# Patient Record
Sex: Female | Born: 1935 | Race: White | Hispanic: No
Health system: Midwestern US, Academic
[De-identification: ages and names within clinical notes are randomized; demographics above are authoritative.]

---

## 2017-01-05 ENCOUNTER — Encounter: Admit: 2017-01-05 | Discharge: 2017-01-05 | Payer: MEDICARE

## 2017-01-05 ENCOUNTER — Ambulatory Visit: Admit: 2017-01-05 | Discharge: 2017-01-06 | Payer: MEDICARE

## 2017-01-05 DIAGNOSIS — W19XXXA Unspecified fall, initial encounter: ICD-10-CM

## 2017-01-05 DIAGNOSIS — M1990 Unspecified osteoarthritis, unspecified site: Principal | ICD-10-CM

## 2017-01-05 DIAGNOSIS — I10 Essential (primary) hypertension: ICD-10-CM

## 2017-01-05 DIAGNOSIS — F0390 Unspecified dementia without behavioral disturbance: ICD-10-CM

## 2017-01-05 DIAGNOSIS — H3530 Unspecified macular degeneration: ICD-10-CM

## 2017-01-05 DIAGNOSIS — F329 Major depressive disorder, single episode, unspecified: ICD-10-CM

## 2017-01-05 DIAGNOSIS — R4189 Other symptoms and signs involving cognitive functions and awareness: Secondary | ICD-10-CM

## 2017-01-05 DIAGNOSIS — G2581 Restless legs syndrome: ICD-10-CM

## 2017-01-05 DIAGNOSIS — K219 Gastro-esophageal reflux disease without esophagitis: ICD-10-CM

## 2017-01-05 DIAGNOSIS — S42009A Fracture of unspecified part of unspecified clavicle, initial encounter for closed fracture: ICD-10-CM

## 2017-01-05 DIAGNOSIS — G20 Parkinson's disease: ICD-10-CM

## 2017-01-05 DIAGNOSIS — N390 Urinary tract infection, site not specified: ICD-10-CM

## 2017-01-05 DIAGNOSIS — G479 Sleep disorder, unspecified: ICD-10-CM

## 2017-01-05 NOTE — Assessment & Plan Note
Her symptoms are not bothersome and no medication changes were recommended during the current visit.

## 2017-01-05 NOTE — Progress Notes
Subjective:             Angelica Washington is a 83 y.o. female.    History of Present Illness     Parkinson's Disease Follow-Up Visit    Since my last visit I am: Slighly worse    Symptoms Scale:    Memory problems: Slight, not bothersome  Hallucinations/delusions: None  Depression: Mild, occurs due to a reason  Anxiety: Slight, not bothersome  Apathy: None  Impulsive behavior: None  Nighttime sleep: Moderate, wake up multiple times  Daytime sleepiness: Slight, occasionally  Vivid dreams: None  REM sleep behavior disorder: None  Restless leg syndrome: Slight, rare  Pain or muscle cramps: Slight, pain or cramps occasionally  Urination: No problems  Constipation: Marked, need prescription medication  Dizziness or lightheadedness: None  Tiredness/Fatigue: Slight, reduced stamina  Falling: Slight, rarely during the year  Personal care assistance: Moderate, I have some difficulty and need some help daily    Total Score:  Total Score: 19    Assistive Devices:  Assistive devices for getting around: Walker    OFF Time:    OFF time: No    Dyskinesia:    Dyskinesia while awake: Yes  Hours/day of dyskinesia: 12  Number of episodes/day: 1  Dyskinesia disability: Do not bother me or my partner    Employment Status:    Employment: Retired - not due to PD        Review of Systems   HENT: Positive for drooling and trouble swallowing.    Gastrointestinal: Negative.    Genitourinary: Positive for urgency. Negative for decreased urine volume, difficulty urinating, dyspareunia, dysuria, enuresis, flank pain, frequency, genital sores, hematuria, menstrual problem, pelvic pain, vaginal bleeding, vaginal discharge and vaginal pain.         Objective:         ??? acetaminophen (TYLENOL) 325 mg tablet Take 325 mg by mouth every 6 hours as needed.   ??? amantadine HCl (SYMMETREL) 100 mg capsule Take 1 Cap by mouth daily.   ??? amLODIPine (NORVASC) 5 mg tablet Take 10 mg by mouth daily. ??? Calcium-Cholecalciferol (D3) (CALCIUM 500+D) 500 mg(1,250mg ) -400 unit chew Take 1 Dose by mouth daily.   ??? carbidopa/levodopa (SINEMET) 25/100 mg tablet Take 1 tablet by mouth six times daily. 7a, 10a, 1p, 4p, 7p,10p   ??? citalopram (CELEXA) 10 mg tablet Take 10 mg by mouth daily.   ??? diclofenac(+) (VOLTAREN) 1 % topical gel Apply 4 g topically to affected area as Needed. Shoulder   ??? furosemide (LASIX) 20 mg tablet Take 20 mg by mouth daily.   ??? guaiFENesin (ROBITUSSIN) 100 mg/5 mL oral solution Take 10-20 mL by mouth every 4 hours as needed.   ??? losartan(+) (COZAAR) 100 mg tablet Take 100 mg by mouth daily.   ??? ondansetron (ZOFRAN) 4 mg tablet Take 4 mg by mouth every 8 hours as needed for Nausea or Vomiting.   ??? polyethylene glycol 3350 (GLYCOLAX; MIRALAX) 17 gram/dose powder Take 17 g by mouth every 48 hours.   ??? pregabalin (LYRICA) 75 mg capsule Take 75 mg by mouth daily.   ??? rOPINIRole (REQUIP) 1 mg tablet Take 1 mg by mouth three times daily.   ??? senna (SENOKOT) 8.6 mg tablet Take 1 Tab by mouth twice daily.   ??? traMADol (ULTRAM) 50 mg tablet Take 50 mg by mouth every 6 hours as needed for Pain.     Vitals:    01/05/17 1102 01/05/17 1104   BP:  125/67 109/58   Pulse: 86 60   Weight: 55 kg (121 lb 4.1 oz)    Height: 144 cm (56.69)      Body mass index is 26.52 kg/m???.     Physical Exam    EXAMINATION:     ORIENTATION: Alert and Oriented.    Cranial Nerves: II-XII Unremarkable except reduced facial expression and soft voice.        Right Left   Bradkinesia  Mild Mild   Tremor Hands Resting Mild None    Postural None None    Kinetic None None           Tremor Other: None  Dyskinesia: Mild  Muscle Strength: Unremarkable  Gait: Requires assistance to stand and walk Needs to push from the chair to stand up     PDQ8 - Quality of Life  Had difficulty getting around in public places?: Ocassionally   Had difficulty dressing?: Sometimes   Felt depressed?: Occasionally Had problems with your close personal relationships?: Never     Had problems with your concentration, for example, when reading or watching TV?: Sometimes   Felt unable to communicate effectively?: Occasionally   Had painful muscle cramps or spasms?: Occasionally   Felt embarrassed in public due to having Parkinson's disease?: Never              PDQ8 Total Score (32 Possible): 8  PDQ8 Total %: 25                               Assessment and Plan:    Problem   Parkinson disease (HCC)    Symptoms began in 1997, initial symptoms was right hand tremor. Diagnosed with Parkinson's disease in 1997. Atypical PD Features: None  06/27/2012 Symptom worse since Requip (ropinirole) was reduced  01/11/2014 PDQ8 Impact:  31%  06/30/2014 PDQ8 Total %: 16   08/13/2014 PDQ8 Total %: 16   01/16/2015 PDQ8 Total %: 19   06/25/2015 PDQ8 Total %: 9   01/05/2016 PDQ8 Total %: 19   07/05/2016 PDQ8 Total %: 19   01/05/2017 PDQ8 Total %: 25   Hand Grip Strength:  RIGHT: 13 KG  LEFT: 15.8 KG    Patient was evaluated by Occupational Therapist, Kelli Reiling.       Depression    06/27/2012 On Lexapro (escitalopram)   11/03/2012 On Celexa (citalopram).   01/05/2013 Geriatric Depression Scale: 5       Cognitive Deficits    05/25/11 MOCA Score (out of 30): 24   06/27/2012 MOCA Score (out of 30): 23          Parkinson disease (HCC)  Her symptoms are stable. No medication changes were recommended during the current  visit.  Patient was evaluated by Occupational Therapist, Harvin Hazel Reiling.      Cognitive deficits  Her symptoms are not bothersome and no medication changes were recommended during the current visit.       Depression  Her symptoms are stable. No medication changes were recommended during the current  visit.      Patient will follow up in approximately 6 months.

## 2017-01-05 NOTE — Assessment & Plan Note
Her symptoms are stable. No medication changes were recommended during the current  visit.

## 2017-01-05 NOTE — Progress Notes
PARKINSON'S DISE[facility]/MOVEMENT DISORDERS CLINIC OCCUPATIONAL THERAPY  PROGRESS NOTE    NAME: Angelica Washington DOB: 03/22/1927 MRN: [id number]  DATE: 01/05/2017  DISABILILTY: Parkinson's Disease     ONSET: Symptoms began in 1997. Diagnosed in 1997.    S: Patient seen in Parkinson's Disease/Movement Disorders clinic this date with patient and daughters    O: ADL STATUS     Feeding: Modified Independent    Grooming: Modified Independent    Bathing: Minimum Assist    UE Dressing: Modified Independent    Toileting: Modified Independent    Able to do: buttons, zippers    Increased time required: Yes    Equipment/Other: Lives in Nursing Home with [employer] bathtub.    MOBILITY    Walk: 50 feet with wheeled walker. Reports 2 falls in last 6 months, one occurred in restroom while pulling up pants, the other occurred while reaching for object on desk and tipping wheelchair over.    Wheelchair: manual; propels with feet or requires assistance of another    Bed: hospital    Other: Lives in Nursing Facility    Transfers: stand-pivot    UE STATUS   Raises arms above the head only by flexing the elbow or using accessory muscles.    Hand Function: Difficulty with fine motor tasks due to dyskinesia    Home Program: Walks 50 feet with walker and standby assist of aid daily, uses arm bike and recumbent bike with aid in nursing home daily.    Communication: Able to communicate wants and needs; low volume and slurred speech; daughters communicated approximately 50% of the time.    Did not discuss energy conservation/work simplification     A/P: Mrs. Harral was seen in the clinic this date with two of her daughters.  Mrs. Snedeker reported two falls in nursing home in last six months.  Recommend utilizing facility staff when assistance is needed. Mrs. Debruler indicated understanding.  Mrs. Blevens and her daughter reported tightness in right ankle causing eversion.  Recommend daily lower extremity stretching.  Mrs. Fratto indicated understanding.  Mrs. Novoa and her daughters denied any further concerns at this time.    OT will continue to follow.    Therapist: Lemar Lofty, OTS; Darick Royston Cowper, OTS; Linus Salmons, OTS; Vonzella Nipple, OTD, OTR/L, MSCS  Date: 01/05/2017

## 2017-01-06 DIAGNOSIS — G20 Parkinson's disease: Principal | ICD-10-CM

## 2017-01-06 DIAGNOSIS — F3289 Other specified depressive episodes: Secondary | ICD-10-CM

## 2017-07-07 ENCOUNTER — Ambulatory Visit: Admit: 2017-07-07 | Discharge: 2017-07-08 | Payer: MEDICARE

## 2017-07-07 ENCOUNTER — Encounter: Admit: 2017-07-07 | Discharge: 2017-07-07 | Payer: MEDICARE

## 2017-07-07 DIAGNOSIS — G2581 Restless legs syndrome: ICD-10-CM

## 2017-07-07 DIAGNOSIS — N390 Urinary tract infection, site not specified: ICD-10-CM

## 2017-07-07 DIAGNOSIS — G20 Parkinson's disease: ICD-10-CM

## 2017-07-07 DIAGNOSIS — M1990 Unspecified osteoarthritis, unspecified site: Principal | ICD-10-CM

## 2017-07-07 DIAGNOSIS — K219 Gastro-esophageal reflux disease without esophagitis: ICD-10-CM

## 2017-07-07 DIAGNOSIS — F329 Major depressive disorder, single episode, unspecified: ICD-10-CM

## 2017-07-07 DIAGNOSIS — I10 Essential (primary) hypertension: ICD-10-CM

## 2017-07-07 DIAGNOSIS — W19XXXA Unspecified fall, initial encounter: ICD-10-CM

## 2017-07-07 DIAGNOSIS — G479 Sleep disorder, unspecified: ICD-10-CM

## 2017-07-07 DIAGNOSIS — H3530 Unspecified macular degeneration: ICD-10-CM

## 2017-07-07 DIAGNOSIS — S42009A Fracture of unspecified part of unspecified clavicle, initial encounter for closed fracture: ICD-10-CM

## 2017-07-07 DIAGNOSIS — F0390 Unspecified dementia without behavioral disturbance: ICD-10-CM

## 2017-07-07 NOTE — Assessment & Plan Note
Her symptoms are stable. No medication changes were recommended during the current  visit.

## 2017-07-07 NOTE — Progress Notes
SPEECH PATHOLOGY ENCOUNTER  DEPARTMENT OF NEUROLOGY    NAME: Angelica Washington    DOB: [birth year]   MRN: [id number]  DATE: 07/07/2017   CLINIC: [facility]    CHIEF COMPLAINT/RELEVANT HISTORY    History provided by: patient and two daughters  Time-in: 11:48 AM  Time-out: 12:09 PM    Speech: Pt has a soft voice. She is asked to repeat frequently. She participated in speech therapy for loudness in the past, but did not continue with voice-related exercises.     Swallow: Pt denies changes to swallow function. She maintains a regular diet, drinks thin liquids and takes pills easily (except one pill that requires extra water to get down). She reports excess saliva at night. Her weight is up a few pounds.     EAT-10 score today: 1   (if the score is greater than 3 it may indicate swallowing problems).      EXAM FINDINGS    Speech:      Current method of communication: verbal    Speech is characterized by:  Decreased volume  Impaired voice quality: hoarse, breathy, monopitch, monotone and reduced loudness  % intelligibility estimate: 80-85%  Speech deficit rating  5 (10=normal, 1=nonvocal)    Situations when speech is difficult to understand: telephone, background noise and when fatigued    Patient report of others having difficulty hearing them: Yes    Hearing:  Any indication of hearing problems? Yes    Swallowing     Current method of intake: Oral    Current Diet: Regular, Thin Liquids    Body Weight Status: gaining    Coughing/Choking: no choking reported, no coughing reported    Other Difficulties Noted: pill sticking on occasion.    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     PLAN OF CARE    Speech  Compensatory behaviors/adjustments (handout provided to patient)  LOUDness exercises daily. Sound Meter Level order instructions provided.     Hearing  No recommendations    Swallowing  Diet regular diet with thin liquids, good oral hygiene and if needed, try pills with yogurt or applesauce.    Therapist: Mardella Layman Jed Kutch  Date: 07/07/2017

## 2017-07-07 NOTE — Progress Notes
Subjective:             Angelica Washington is a 83 y.o. female.    History of Present Illness     Parkinson's Disease Follow-Up Visit    Since my last visit I am: Slightly better    Symptoms Scale:    Memory problems: Slight, not bothersome  Hallucinations/delusions: None  Depression: None  Anxiety: None  Apathy: None  Impulsive behavior: None  Nighttime sleep: Mild, wake up to go to bathroom  Daytime sleepiness: Mild, after taking my medications  Vivid dreams: Slight, occasional  REM sleep behavior disorder: None  Restless leg syndrome: Moderate, occasionally at night  Pain or muscle cramps: Mild, muscle or joint pain need medications  Urination: Slight, go frequently, not bothersome  Constipation: Moderate, need OTC medication  Dizziness or lightheadedness: None  Tiredness/Fatigue: Slight, reduced stamina  Falling: Slight, rarely during the year  Personal care assistance: Moderate, I have some difficulty and need some help daily    Total Score:  Total Score: 20    Assistive Devices:  Assistive devices for getting around: Walker(or WC)    OFF Time:    OFF time: Yes  Hours/day of OFF time: 1  Number of episodes/day: 1  OFF time is bothersome: Some of the time  Muscles spasms or strange posturing: Yes  OFF disability: Do not bother me or my partner    Dyskinesia:    Dyskinesia while awake: Yes  Hours/day of dyskinesia: 12  Number of episodes/day: 1  Dyskinesia disability: Do not bother me or my partner    Employment Status:    Employment: Retired - not due to PD        Review of Systems   HENT: Positive for drooling and trouble swallowing.    Gastrointestinal: Negative.    Genitourinary: Positive for urgency. Negative for decreased urine volume, difficulty urinating, dyspareunia, dysuria, enuresis, flank pain, frequency, genital sores, hematuria, menstrual problem, pelvic pain, vaginal bleeding, vaginal discharge and vaginal pain.         Objective: ??? acetaminophen (TYLENOL) 325 mg tablet Take 325 mg by mouth every 6 hours as needed.   ??? amantadine HCl (SYMMETREL) 100 mg capsule Take 1 Cap by mouth daily.   ??? amLODIPine (NORVASC) 5 mg tablet Take 10 mg by mouth daily.   ??? Calcium-Cholecalciferol (D3) (CALCIUM 500+D) 500 mg(1,250mg ) -400 unit chew Take 1 Dose by mouth daily.   ??? carbidopa/levodopa (SINEMET) 25/100 mg tablet Take 1 tablet by mouth six times daily. 7a, 10a, 1p, 4p, 7p,10p   ??? citalopram (CELEXA) 10 mg tablet Take 10 mg by mouth daily.   ??? diclofenac(+) (VOLTAREN) 1 % topical gel Apply 4 g topically to affected area as Needed. Shoulder   ??? furosemide (LASIX) 20 mg tablet Take 20 mg by mouth daily.   ??? guaiFENesin (ROBITUSSIN) 100 mg/5 mL oral solution Take 10-20 mL by mouth every 4 hours as needed.   ??? losartan(+) (COZAAR) 100 mg tablet Take 100 mg by mouth daily.   ??? ondansetron (ZOFRAN) 4 mg tablet Take 4 mg by mouth every 8 hours as needed for Nausea or Vomiting.   ??? polyethylene glycol 3350 (GLYCOLAX; MIRALAX) 17 gram/dose powder Take 17 g by mouth every 48 hours.   ??? pregabalin (LYRICA) 75 mg capsule Take 75 mg by mouth daily.   ??? rOPINIRole (REQUIP) 1 mg tablet Take 1 mg by mouth three times daily.   ??? senna (SENOKOT) 8.6 mg tablet Take 1 Tab by  mouth twice daily.   ??? traMADol (ULTRAM) 50 mg tablet Take 50 mg by mouth every 6 hours as needed for Pain.     Vitals:    07/07/17 1123 07/07/17 1125   BP: 128/69 96/55   Pulse: 58 57   Weight: 57.1 kg (125 lb 12.8 oz)    Height: 143.5 cm (56.5)      Body mass index is 27.71 kg/m???.     Physical Exam    EXAMINATION:     ORIENTATION: Alert and Oriented.    Cranial Nerves: II-XII Unremarkable except reduced facial expression and soft voice.        Right Left   Bradkinesia  Mild Mild   Tremor Hands Resting Mild None    Postural None None    Kinetic None None           Tremor Other: None  Dyskinesia: Mild  Muscle Strength: Unremarkable  Gait: Requires assistance to stand and walk PDQ8 - Quality of Life  Had difficulty getting around in public places?: Ocassionally   Had difficulty dressing?: Sometimes   Felt depressed?: Never   Had problems with your close personal relationships?: Never     Had problems with your concentration, for example, when reading or watching TV?: Occasionally   Felt unable to communicate effectively?: Never   Had painful muscle cramps or spasms?: Never   Felt embarrassed in public due to having Parkinson's disease?: Never              PDQ8 Total Score (32 Possible): 4  PDQ8 Total %: 12              Geriatric Depression Scale: 3 suggesting no depression                   Assessment and Plan:    Problem   Parkinson Disease (Hcc)    Symptoms began in 1997, initial symptoms was right hand tremor. Diagnosed with Parkinson's disease in 1997. Atypical PD Features: None  06/27/2012 Symptom worse since Requip (ropinirole) was reduced  01/11/2014 PDQ8 Impact:  31%  06/30/2014 PDQ8 Total %: 16   08/13/2014 PDQ8 Total %: 16   01/16/2015 PDQ8 Total %: 19   07/05/2016 PDQ8 Total %: 19   01/05/2017 PDQ8 Total %: 25   Patient was evaluated by Occupational Therapist, Harvin Hazel Reiling.    07/07/2017 PDQ8 Total %: 12   Patient was evaluated by Speech therapist, Mardella Layman Heidrick.       Depression    06/27/2012 On Lexapro (escitalopram)   11/03/2012 On Celexa (citalopram).   01/05/2013 Geriatric Depression Scale: 5       Cognitive Deficits    05/25/11 MOCA Score (out of 30): 24   06/27/2012 MOCA Score (out of 30): 23          Parkinson disease (HCC)  Her symptoms are stable. No medication changes were recommended during the current  visit. Patient was evaluated by Speech therapist, Mardella Layman Heidrick.      Cognitive deficits  Her symptoms are stable. No medication changes were recommended during the current  visit.      Depression  Her symptoms are stable. No medication changes were recommended during the current  visit.      Patient will follow up in approximately 6 months.

## 2017-07-08 DIAGNOSIS — G20 Parkinson's disease: Principal | ICD-10-CM

## 2017-07-08 DIAGNOSIS — F3289 Other specified depressive episodes: ICD-10-CM

## 2017-07-08 DIAGNOSIS — R4189 Other symptoms and signs involving cognitive functions and awareness: ICD-10-CM

## 2017-08-20 DEATH — deceased

## 2018-05-30 IMAGING — CT Thorax^1_CHEST_WITHOUT (Adult)
1 series · 15 of 31 positions shown, 19 images · non-contrast
Comparison: none

[Series 2: chest w/o 5.0 soft tissue · axial · non-contrast · 0.60mm/px · z∈[+920,+1145]mm · 15 of 49 slices shown, 19 images]
[im 2/49  mediastinal]
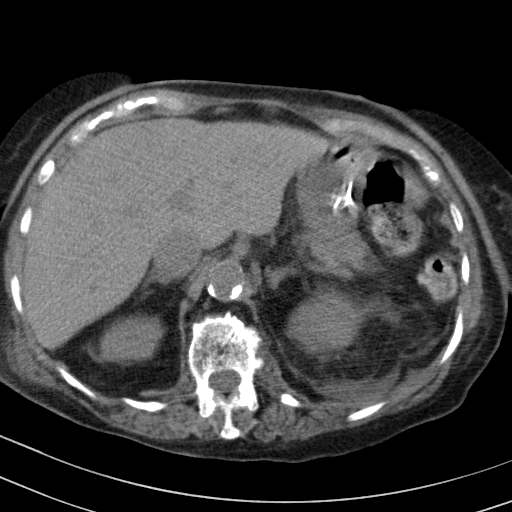
[im 2/49  lung]
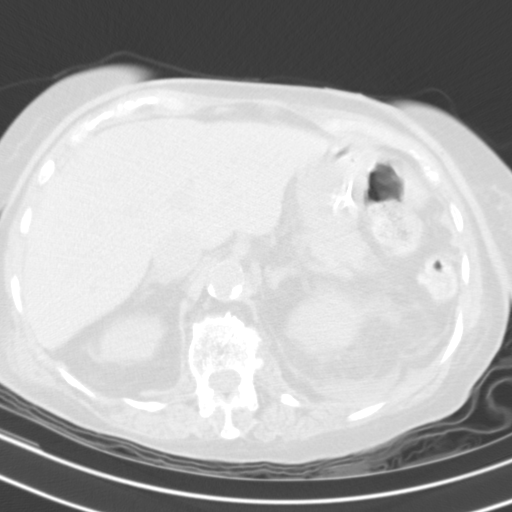
[im 6/49  lung]
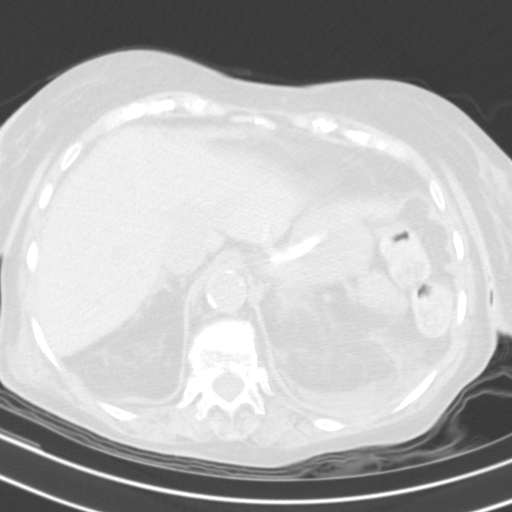
[im 9/49  lung]
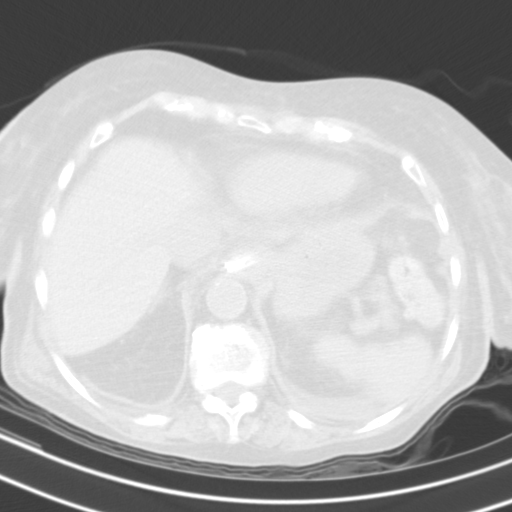
[im 11/49  lung]
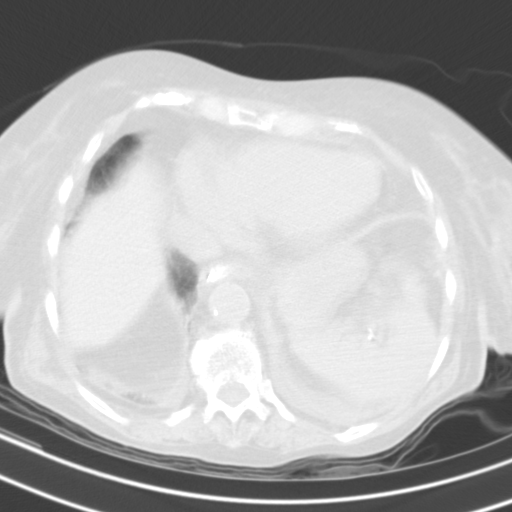
[im 15/49  mediastinal]
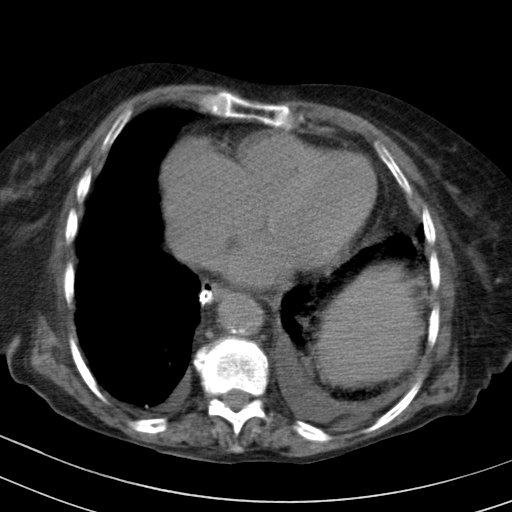
[im 15/49  lung]
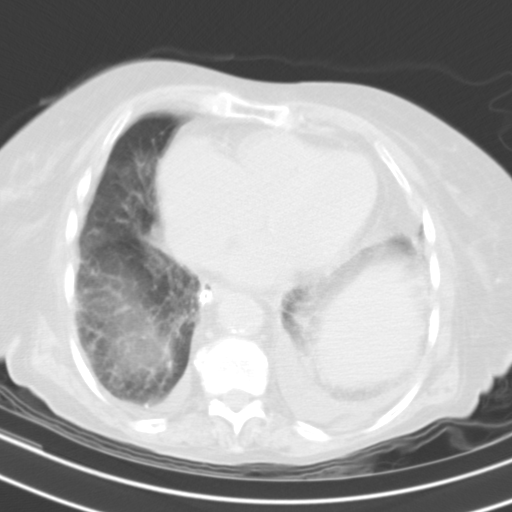
[im 18/49  lung]
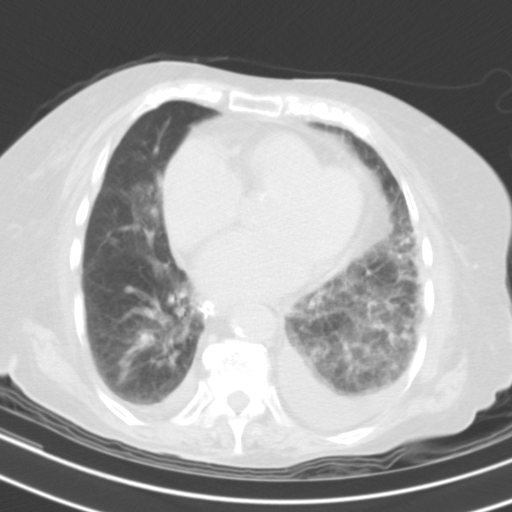
[im 22/49  lung]
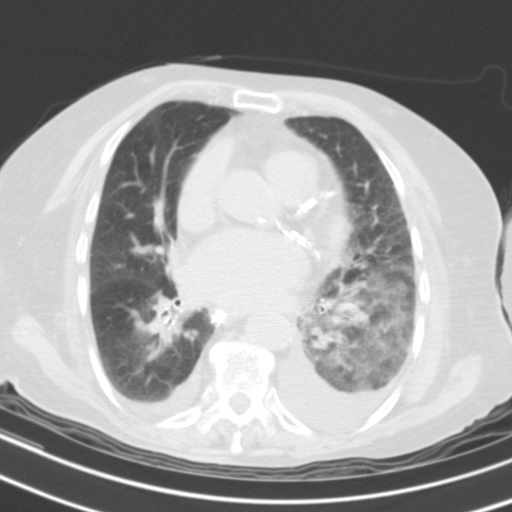
[im 24/49  lung]
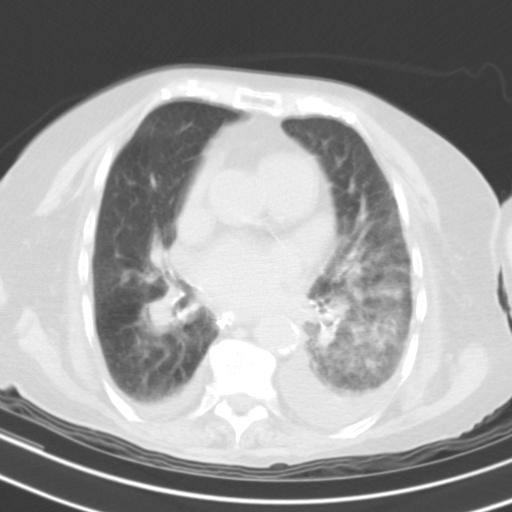
[im 27/49  mediastinal]
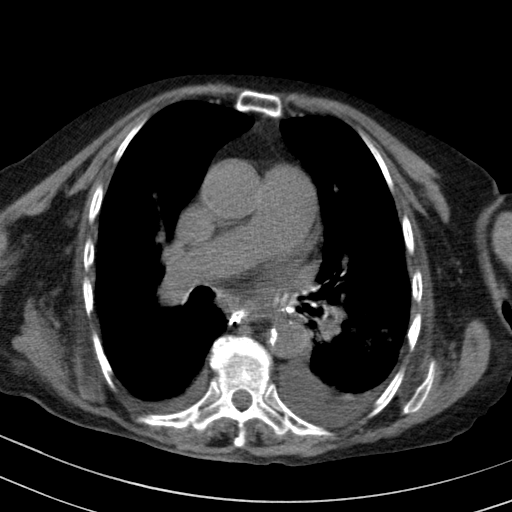
[im 27/49  lung]
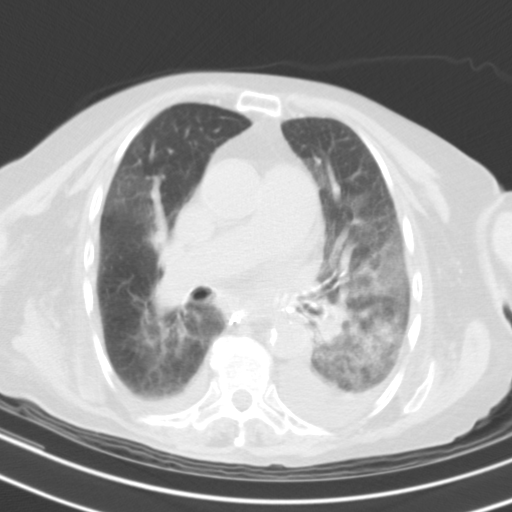
[im 31/49  lung]
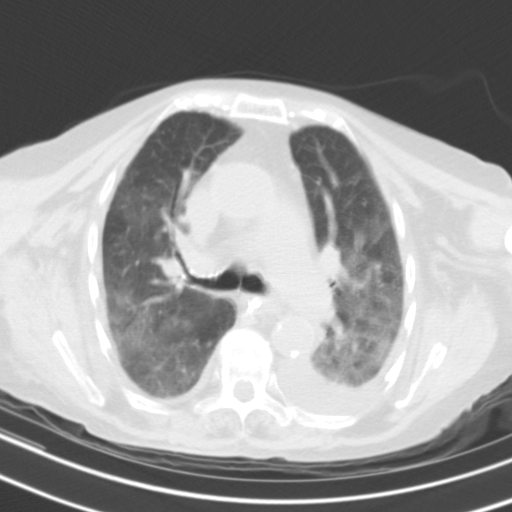
[im 34/49  lung]
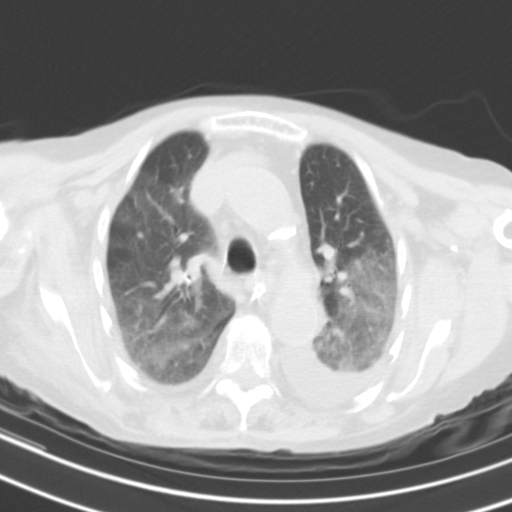
[im 38/49  lung]
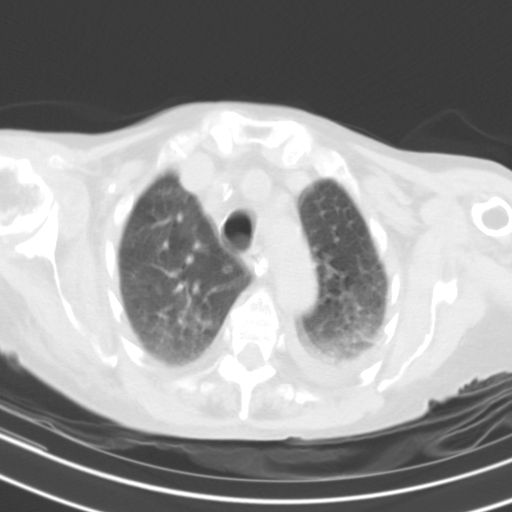
[im 40/49  mediastinal]
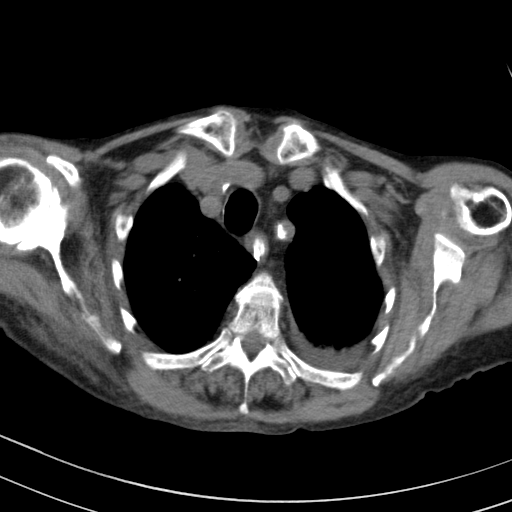
[im 40/49  lung]
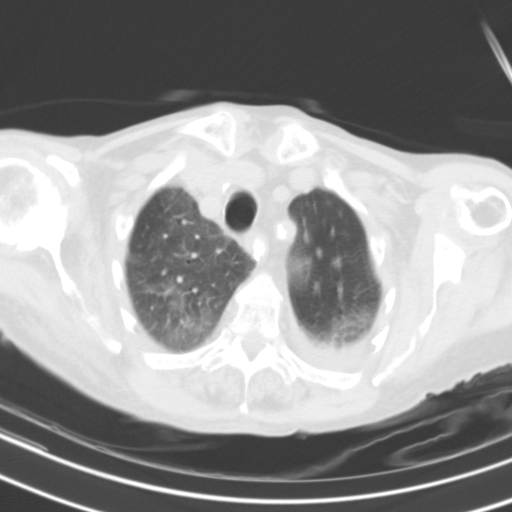
[im 43/49  lung]
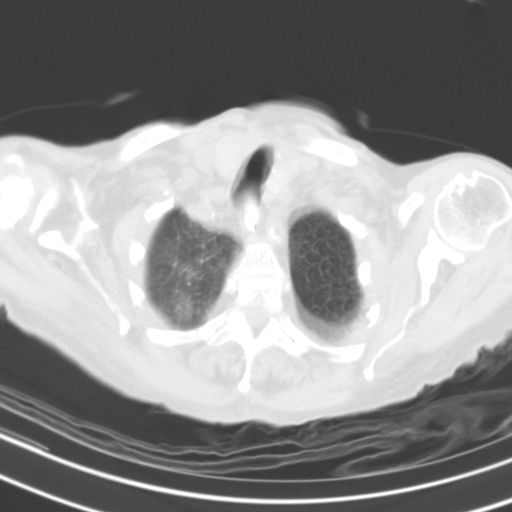
[im 47/49  lung]
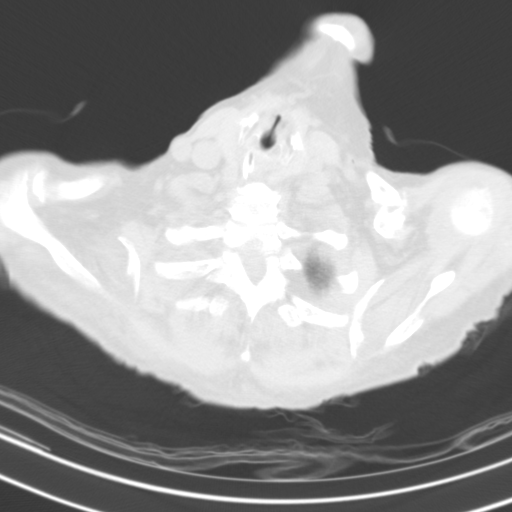

[15 of 31 positions shown; findings below may reference images not displayed]

DIAGNOSTIC STUDIES

EXAM

CT CHEST WITHOUT CONTRAST

INDICATION

pneumoia
F/U PNEUMONIA. TJ/CK

TECHNIQUE

Contiguous transaxial imaging through the chest were obtained without contrast. Coronal and
sagittal reformations were obtained from the transaxial source data.

All CT scans at this facility use dose modulation, iterative reconstruction, and/or weight based
dosing when appropriate to reduce radiation dose to as low as reasonably achievable.

COMPARISONS

Chest radiograph 07/26/2017

FINDINGS

The visualized thyroid gland is unremarkable. There is no thoracic lymphadenopathy. The heart size
is normal without pericardial effusion. There is calcified coronary artery atherosclerosis. The
thoracic aorta and main pulmonary artery are normal in caliber. Mild calcified aortic
atherosclerosis. There are dense calcifications at the left subclavian artery origin causing
narrowing.

Evaluation of the lungs is limited by motion artifact. There are patchy consolidative and ground-
glass opacities in the left lower lobe, and to a lesser extent the posterior left upper lobe. There
are milder interstitial and ground-glass opacities in the right upper and lower lobes. Small left
greater than right pleural effusions.

A nasogastric tube enters the stomach in terminates at a view. Trace ascites in upper abdomen.
There is an old distal left clavicular fracture. Severe right glenohumeral osteoarthrosis.

IMPRESSION

Bilateral pulmonary opacities, greatest in the left lower lobe, suspicious for pneumonia. Small
bilateral pleural effusions.
